# Patient Record
Sex: Female | Born: 2000 | Race: White | Hispanic: No | Marital: Single | Smoking: Never smoker
Health system: Southern US, Community
[De-identification: ages and names within clinical notes are randomized; demographics above are authoritative.]

## PROBLEM LIST (undated history)

## (undated) DIAGNOSIS — R195 Other fecal abnormalities: Secondary | ICD-10-CM

## (undated) DIAGNOSIS — R109 Unspecified abdominal pain: Secondary | ICD-10-CM

## (undated) HISTORY — DX: Other fecal abnormalities: R19.5

## (undated) HISTORY — DX: Unspecified abdominal pain: R10.9

---
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
## 2023-01-22 ENCOUNTER — Emergency Department
Admission: EM | Admit: 2023-01-22 | Discharge: 2023-01-22 | Disposition: A | Discharge status: Home / Self Care | Payer: Medicaid Other | Attending: Emergency Medicine | Admitting: Emergency Medicine

## 2023-01-22 ENCOUNTER — Other Ambulatory Visit: Payer: Medicaid Other

## 2023-01-22 ENCOUNTER — Emergency Department: Payer: Medicaid Other

## 2023-01-22 ENCOUNTER — Other Ambulatory Visit: Payer: Self-pay

## 2023-01-22 DIAGNOSIS — S92351A Displaced fracture of fifth metatarsal bone, right foot, initial encounter for closed fracture: Secondary | ICD-10-CM | POA: Diagnosis not present

## 2023-01-22 DIAGNOSIS — Y9301 Activity, walking, marching and hiking: Secondary | ICD-10-CM | POA: Diagnosis not present

## 2023-01-22 DIAGNOSIS — M7989 Other specified soft tissue disorders: Secondary | ICD-10-CM | POA: Diagnosis not present

## 2023-01-22 DIAGNOSIS — X501XXA Overexertion from prolonged static or awkward postures, initial encounter: Secondary | ICD-10-CM | POA: Insufficient documentation

## 2023-01-22 DIAGNOSIS — S99921A Unspecified injury of right foot, initial encounter: Secondary | ICD-10-CM | POA: Diagnosis present

## 2023-01-22 MED ORDER — OXYCODONE-ACETAMINOPHEN 5-325 MG PO TABS
1.0000 | ORAL_TABLET | Freq: Once | ORAL | Status: AC
  Administered 2023-01-22: 1 via ORAL
  Filled 2023-01-22: qty 1

## 2023-01-22 MED ORDER — HYDROCODONE-ACETAMINOPHEN 5-325 MG PO TABS: 1.0000 | ORAL_TABLET | ORAL | 0 refills | Status: AC | PRN

## 2023-01-22 NOTE — ED Triage Notes (Signed)
EMS states "called out for fall at home" patient states "I was walking off the steps and it was muddy and my foot slipped and twisted. I went in the house and could not walk on it"

## 2023-01-22 NOTE — ED Provider Notes (Signed)
[facility] Provider Note  Patient Contact: 9:58 PM (approximate)   History   Ankle Pain   HPI  Jordan Walsh is a 22 y.o. female who presents emergency department complaining of right foot pain.  Patient was coming down a flight of stairs when she landed awkwardly on her right foot.  Patient felt a crunching pain, is having significant pain to the midfoot.  No open wounds.  No history of previous fractures.  No other complaints at this time.     Physical Exam   Triage Vital Signs: ED Triage Vitals  Enc Vitals Group     BP 01/22/23 2113 114/65     Pulse Rate 01/22/23 2113 (!) 108     Resp 01/22/23 2113 19     Temp 01/22/23 2113 98.4 F (36.9 C)     Temp Source 01/22/23 2113 Oral     SpO2 01/22/23 2113 98 %     Weight 01/22/23 2114 170 lb (77.1 kg)     Height 01/22/23 2114 5\' 3"  (1.6 m)     Head Circumference --      Peak Flow --      Pain Score 01/22/23 2114 8     Pain Loc --      Pain Edu? --      Excl. in [place]? --     Most recent vital signs: Vitals:   01/22/23 2130 01/22/23 2200  BP: 123/72 106/72  Pulse: (!) 106 94  Resp:    Temp:    SpO2: 100% 100%     General: Alert and in no acute distress.  Cardiovascular:  Good peripheral perfusion Respiratory: Normal respiratory effort without tachypnea or retractions. Lungs CTAB.  Musculoskeletal: Full range of motion to all extremities.  Neurologic:  No gross focal neurologic deficits are appreciated.  Skin:   No rash noted Other:   ED Results / Procedures / Treatments   Labs (all labs ordered are listed, but only abnormal results are displayed) Labs Reviewed - No data to display   EKG     RADIOLOGY  I personally viewed, evaluated, and interpreted these images as part of my medical decision making, as well as reviewing the written report by the radiologist.  ED Provider Interpretation: Oblique fracture of the fifth metatarsal  DG Foot Complete Right  Result Date:  01/22/2023 CLINICAL DATA:  Recent twisting injury with foot pain initial encounter EXAM: RIGHT FOOT COMPLETE - 3+ VIEW COMPARISON:  None Available. FINDINGS: Oblique fracture through the mid to distal fifth metatarsal is noted. Minimal displacement is seen. No other fractures are noted. No significant soft tissue abnormality is seen. IMPRESSION: Fifth metatarsal fracture as described. Electronically Signed   By: Inez Catalina M.D.   On: 01/22/2023 22:34   DG Ankle Complete Right  Result Date: 01/22/2023 CLINICAL DATA:  Twisting injury with ankle pain, initial encounter EXAM: RIGHT ANKLE - COMPLETE 3+ VIEW COMPARISON:  None Available. FINDINGS: There is no evidence of fracture, dislocation, or joint effusion. There is no evidence of arthropathy or other focal bone abnormality. Soft tissues are unremarkable. IMPRESSION: No acute abnormality noted. Electronically Signed   By: Inez Catalina M.D.   On: 01/22/2023 22:33    PROCEDURES:  Critical Care performed: No  Procedures   MEDICATIONS ORDERED IN ED: Medications  oxyCODONE-acetaminophen (PERCOCET/ROXICET) 5-325 MG per tablet 1 tablet (1 tablet Oral Given 01/22/23 2201)     IMPRESSION / MDM / ASSESSMENT AND PLAN / ED COURSE  I reviewed  the triage vital signs and the nursing notes.                                 Differential diagnosis includes, but is not limited to, foot contusion, foot sprain, foot fracture  Patient's presentation is most consistent with acute presentation with potential threat to life or bodily function.   Patient's diagnosis is consistent with fracture of the fifth metatarsal.  Patient presents emergency department foot pain after missing a step, feeling a crunching pain in the side of her foot.  Patient has extreme tenderness over the fifth metatarsal.  X-ray confirms fracture of this area.  Patiently placed in a splint, crutches for ambulation.  Limited amount of pain medication is prescribed for the patient.  Follow-up  with podiatry.  Return precautions discussed with the patient..  Patient is given ED precautions to return to the ED for any worsening or new symptoms.     FINAL CLINICAL IMPRESSION(S) / ED DIAGNOSES   Final diagnoses:  Closed displaced fracture of fifth metatarsal bone of right foot, initial encounter     Rx / DC Orders   ED Discharge Orders          Ordered    HYDROcodone-acetaminophen (NORCO/VICODIN) 5-325 MG tablet  Every 4 hours PRN        01/22/23 2303             Note:  This document was prepared using Dragon voice recognition software and may include unintentional dictation errors.   Brynda Peon 01/22/23 2303    Rada Hay, MD 01/23/23 [phone]

## 2023-01-22 NOTE — ED Notes (Signed)
Error in documentation. Patient not currently discharged

## 2023-01-22 NOTE — ED Notes (Signed)
2+ pedal pulse present in right foot

## 2023-01-24 ENCOUNTER — Ambulatory Visit (INDEPENDENT_AMBULATORY_CARE_PROVIDER_SITE_OTHER): Payer: Medicaid Other | Admitting: Podiatry

## 2023-01-24 DIAGNOSIS — S92301D Fracture of unspecified metatarsal bone(s), right foot, subsequent encounter for fracture with routine healing: Secondary | ICD-10-CM | POA: Diagnosis not present

## 2023-01-24 MED ORDER — HYDROCODONE-ACETAMINOPHEN 10-325 MG PO TABS: 1.0000 | ORAL_TABLET | ORAL | 0 refills | Status: AC | PRN

## 2023-01-24 MED ORDER — KETOROLAC TROMETHAMINE 10 MG PO TABS: 10.0000 mg | ORAL_TABLET | Freq: Four times a day (QID) | ORAL | 0 refills | Status: AC | PRN

## 2023-01-24 NOTE — Progress Notes (Signed)
  Subjective:  Patient ID: Jordan Walsh, female    DOB: October 21, 2001,  MRN: [id number]  Chief Complaint  Patient presents with   Foot Pain    Left foot  Had xrays at hospital     22 y.o. female presents with the above complaint.  Patient presents with follow-up from emergency room for right foot fracture.  Patient states that she twisted it while in the muddy yard.  She wanted to get it evaluated she would like to discuss if she needs surgery.  She has not seen anyone as prior to the emergency room.  Pain scale is 5 out of 10 hurts with ambulation worse with pressure she is currently taking hydrocodone which does not seem to be helping with her pain.  Date of injury 01/22/2023   Review of Systems: Negative except as noted in the HPI. Denies N/V/F/Ch.  Past Medical History:  Diagnosis Date   Abdominal pain, recurrent    Stool culture positive for Clostridium difficile     Current Outpatient Medications:    desonide (DESOWEN) 0.05 % cream, Apply to whitish patch on face twice daily for 1 month., Disp: , Rfl:    HYDROcodone-acetaminophen (NORCO) 10-325 MG tablet, Take 1 tablet by mouth every 4 (four) hours as needed for up to 5 days., Disp: 30 tablet, Rfl: 0   ketorolac (TORADOL) 10 MG tablet, Take 1 tablet (10 mg total) by mouth every 6 (six) hours as needed., Disp: 20 tablet, Rfl: 0   ondansetron (ZOFRAN-ODT) 4 MG disintegrating tablet, Take by mouth., Disp: , Rfl:    HYDROcodone-acetaminophen (NORCO/VICODIN) 5-325 MG tablet, Take 1 tablet by mouth every 4 (four) hours as needed for moderate pain., Disp: 20 tablet, Rfl: 0  Social History   Tobacco Use  Smoking Status Never  Smokeless Tobacco Never    No Known Allergies Objective:  There were no vitals filed for this visit. There is no height or weight on file to calculate BMI. Constitutional Well developed. Well nourished.  Vascular Dorsalis pedis pulses palpable bilaterally. Posterior tibial pulses palpable  bilaterally. Capillary refill normal to all digits.  No cyanosis or clubbing noted. Pedal hair growth normal.  Neurologic Normal speech. Oriented to person, place, and time. Epicritic sensation to light touch grossly present bilaterally.  Dermatologic Nails well groomed and normal in appearance. No open wounds. No skin lesions.  Orthopedic: Pain on palpation to the right fifth metatarsal phalangeal joint head.  No rate pain with range of motion of the fifth digit on the MPJ.  Ecchymosis noted.  Swelling noted.   Radiographs: Oblique fracture through the mid to distal fifth metatarsal is noted. Minimal displacement is seen. No other fractures are noted. No significant soft tissue abnormality is seen. Assessment:   1. Closed fracture of head of metatarsal bone of right foot with routine healing    Plan:  Patient was evaluated and treated and all questions answered.  Right fifth metatarsal fracture nondisplaced -All questions concerns were discussed with the patient in extensive detail.  Given the amount of pain that she is having I will increase her dosage of hydrocodone from 5 mg to 10 mg with incorporation of Toradol. -She will benefit from cam boot immobilization.  At this time this is a nondisplaced fracture does not require surgery I discussed with the patient she states understanding.  Cam boot was dispensed  No follow-ups on file.

## 2023-02-06 ENCOUNTER — Ambulatory Visit: Payer: Medicaid Other | Admitting: Podiatry

## 2023-03-03 ENCOUNTER — Ambulatory Visit: Payer: Medicaid Other | Admitting: Podiatry

## 2023-03-03 DIAGNOSIS — S92301D Fracture of unspecified metatarsal bone(s), right foot, subsequent encounter for fracture with routine healing: Secondary | ICD-10-CM | POA: Diagnosis not present

## 2023-03-03 NOTE — Progress Notes (Signed)
  Subjective:  Patient ID: Jordan Walsh, female    DOB: 04-27-2001,  MRN: [id number]  Chief Complaint  Patient presents with   Fracture    Pt stated that she is doing better     22 y.o. female presents with the above complaint.  Patient presents with follow-up of right foot fracture.  She states she is doing a lot better.  Cam boot immobilization helped considerably.  Denies any other acute complaint she has transition to regular shoes without any acute issues   Review of Systems: Negative except as noted in the HPI. Denies N/V/F/Ch.  Past Medical History:  Diagnosis Date   Abdominal pain, recurrent    Stool culture positive for Clostridium difficile     Current Outpatient Medications:    desonide (DESOWEN) 0.05 % cream, Apply to whitish patch on face twice daily for 1 month., Disp: , Rfl:    HYDROcodone-acetaminophen (NORCO/VICODIN) 5-325 MG tablet, Take 1 tablet by mouth every 4 (four) hours as needed for moderate pain., Disp: 20 tablet, Rfl: 0   ketorolac (TORADOL) 10 MG tablet, Take 1 tablet (10 mg total) by mouth every 6 (six) hours as needed., Disp: 20 tablet, Rfl: 0   ondansetron (ZOFRAN-ODT) 4 MG disintegrating tablet, Take by mouth., Disp: , Rfl:   Social History   Tobacco Use  Smoking Status Never  Smokeless Tobacco Never    No Known Allergies Objective:  There were no vitals filed for this visit. There is no height or weight on file to calculate BMI. Constitutional Well developed. Well nourished.  Vascular Dorsalis pedis pulses palpable bilaterally. Posterior tibial pulses palpable bilaterally. Capillary refill normal to all digits.  No cyanosis or clubbing noted. Pedal hair growth normal.  Neurologic Normal speech. Oriented to person, place, and time. Epicritic sensation to light touch grossly present bilaterally.  Dermatologic Nails well groomed and normal in appearance. No open wounds. No skin lesions.  Orthopedic: No further pain on palpation to the  right fifth metatarsal phalangeal joint head.  No rate pain with range of motion of the fifth digit on the MPJ.  No further ecchymosis noted.  No further swelling noted.   Radiographs: Oblique fracture through the mid to distal fifth metatarsal is noted. Minimal displacement is seen. No other fractures are noted. No significant soft tissue abnormality is seen. Assessment:   1. Closed fracture of head of metatarsal bone of right foot with routine healing     Plan:  Patient was evaluated and treated and all questions answered.  Right fifth metatarsal fracture nondisplaced -Clinically healed and doing much better.  She has returned to regular shoes.  At this time if any foot and ankle issues on future she will come back and see me.  I encouraged her that I will continue to heal from the inside and she may have occasional pain and achiness but overall her pain will improve as time goes on.  She states understanding No follow-ups on file.

## 2023-03-20 ENCOUNTER — Ambulatory Visit: Payer: Medicaid Other | Admitting: Podiatry

## 2023-03-29 ENCOUNTER — Ambulatory Visit: Payer: Medicaid Other | Admitting: Podiatry

## 2023-10-04 ENCOUNTER — Ambulatory Visit
Admission: EM | Admit: 2023-10-04 | Discharge: 2023-10-04 | Payer: Medicaid Other | Attending: Family Medicine | Admitting: Family Medicine

## 2023-10-04 DIAGNOSIS — K921 Melena: Secondary | ICD-10-CM

## 2023-10-04 DIAGNOSIS — R1033 Periumbilical pain: Secondary | ICD-10-CM

## 2023-10-04 DIAGNOSIS — R197 Diarrhea, unspecified: Secondary | ICD-10-CM | POA: Diagnosis not present

## 2023-10-04 NOTE — ED Provider Notes (Signed)
[facility]    CSN: [id number] Arrival date & time: 10/04/23  1258      History   Chief Complaint Chief Complaint  Patient presents with   Abdominal Pain   Emesis   Nausea   Diarrhea    HPI Jordan Walsh is a 22 y.o. female.   HPI  Krisna presents for intermittent achy periumbilical abdominal pain that started last night. Pain doesn't radiate.  Reports feeling like she is constipated but is having non-bloody diarrhea. Had an episode of black stool. Vomited twice and the last time was this morning around 11 AM. Took Zofran this morning after vomiting.  Had 4 episodes 2 of which were today.  Nothing makes pain worse. Has worse pain with eating.   She has abdominal pain frequently in the same area.    Past Surgeries: no abdominal surgery   Symptoms Nausea/Vomiting: yes  Diarrhea: yes  Constipation: -   Melena/BRBPR: yes, melena  Hematemesis: no  Anorexia: no Fever/Chills: no  Dysuria: no  Urinary frequency or urgency: No  Rash: no  Wt loss: no  EtOH use: no  NSAIDs/ASA: infrequent   Patient's last menstrual period was 09/26/2023 (approximate). 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 No pertinent surgical history.  OB History   No obstetric history on file.      Home Medications    Prior to Admission medications   Medication Sig Start Date End Date Taking? Authorizing Provider  desonide (DESOWEN) 0.05 % cream Apply to whitish patch on face twice daily for 1 month. 09/05/13   [provider]  HYDROcodone-acetaminophen (NORCO/VICODIN) 5-325 MG tablet Take 1  tablet by mouth every 4 (four) hours as needed for moderate pain. 01/22/23 01/22/24  Cuthriell, Delorise Royals, PA-C  ketorolac (TORADOL) 10 MG tablet Take 1 tablet (10 mg total) by mouth every 6 (six) hours as needed. 01/24/23   Candelaria Stagers, DPM  ondansetron (ZOFRAN-ODT) 4 MG disintegrating tablet Take by mouth. 11/15/16   [provider]    Family History Family History  Problem Relation Age of Onset   Cholelithiasis Mother    Ulcers Mother    Constipation Sister    Constipation Brother    Constipation Brother    Constipation Brother     Social History Social History   Tobacco Use   Smoking status: Never   Smokeless tobacco: Never     Allergies   Patient has no known allergies.   Review of Systems Review of Systems :negative unless otherwise stated in HPI.      Physical Exam Triage Vital Signs ED Triage Vitals  Encounter Vitals Group     BP 10/04/23 1338 125/65     Systolic BP Percentile --      Diastolic BP Percentile --      Pulse Rate 10/04/23 1338 80     Resp 10/04/23 1338 16     Temp 10/04/23 1338 98.7 F (37.1 C)     Temp Source 10/04/23 1338 Oral     SpO2 10/04/23 1338 100 %  Weight 10/04/23 1337 170 lb (77.1 kg)     Height 10/04/23 1337 5\' 3"  (1.6 m)     Head Circumference --      Peak Flow --      Pain Score 10/04/23 1342 4     Pain Loc --      Pain Education --      Exclude from Growth Chart --    No data found.  Updated Vital Signs BP 125/65 (BP Location: Left Arm)   Pulse 80   Temp 98.7 F (37.1 C) (Oral)   Resp 16   Ht 5\' 3"  (1.6 m)   Wt 77.1 kg   LMP 09/26/2023 (Approximate)   SpO2 100%   BMI 30.11 kg/m   Visual Acuity Right Eye Distance:   Left Eye Distance:   Bilateral Distance:    Right Eye Near:   Left Eye Near:    Bilateral Near:     Physical Exam  GEN: non-toxic appearing female, in no acute distress  CV: regular rate and rhythm RESP: no increased work of breathing, clear to ascultation bilaterally ABD:  Bowel sounds present. Soft, epigastric, RUQ and periumbilical tenderness, non-distended. No guarding,no rebound,no appreciable hepatosplenomegaly,no CVA tenderness,negative McBurney's,negative Murphy MSK: no extremity edema SKIN: warm, dry, no rash on visible skin   UC Treatments / Results  Labs (all labs ordered are listed, but only abnormal results are displayed) Labs Reviewed  URINALYSIS, W/ REFLEX TO CULTURE (INFECTION SUSPECTED)  COMPREHENSIVE METABOLIC PANEL  CBC WITH DIFFERENTIAL/PLATELET  LIPASE, BLOOD  OCCULT BLOOD X 1 CARD TO LAB, STOOL    EKG  If EKG performed, see my interpretation and MDM section  Radiology No results found.   Procedures Procedures (including critical care time)  Medications Ordered in UC Medications - No data to display  Initial Impression / Assessment and Plan / UC Course  I have reviewed the triage vital signs and the nursing notes.  Pertinent labs & imaging results that were available during my care of the patient were reviewed by me and considered in my medical decision making (see chart for details).       Patient is a  22 y.o. femalewith history of abdominal pain who presents after having insidious severe periumbilical abdominal pain last night.  On chart review she had an ultrasound of her abdomen back in March or 2013 for abdominal pain and vomiting that showed slightly prominent common bile duct.    Overall, patient is non-toxic appearing, well-hydrated, and in no acute distress.  Vital signs stable.  [place] afebrile.  She has periumbilical, epigastric and right upper quadrant tenderness on exam.  Ordered Hemoccult, UA, CBC, CMP, and lipase. CT ABP Pelvis with contrast.  Considered COVID testing but she only has diarrhea and abdominal pain. She has not vomited since taking the Zofran this morning. Pt declined Hemoccult.   Her CT abdomen pelvis was declined by her insurance company.  After speaking with mom, who also agrees that  Jordan Walsh does need to have abdominal imaging and will take her to [facility] for further evaluation.  Urgent care workup truncated.      Follow-up, return and ED precautions given. Discussed MDM, treatment plan and plan for follow-up with patient and her mom who agrees with plan.    Final Clinical Impressions(s) / UC Diagnoses   Final diagnoses:  Periumbilical abdominal pain  Melena  Diarrhea, unspecified type     Discharge Instructions      You have been advised to follow  up immediately in the emergency department for concerning signs or symptoms as discussed during your visit. If you declined EMS transport, please have a family member take you directly to the ED at this time. Do not delay.   Based on concerns about condition, if you do not follow up in the ED, you may risk poor outcomes including worsening of condition, delayed treatment and potentially life threatening issues. If you have declined to go to the ED at this time, you should call your PCP immediately to set up a follow up appointment.   Go to ED for red flag symptoms, including; fevers you cannot reduce with Tylenol/Motrin, severe headaches, vision changes, numbness/weakness in part of the body, lethargy, confusion, intractable vomiting, severe dehydration, chest pain, breathing difficulty, severe persistent abdominal or pelvic pain, signs of severe infection (increased redness, swelling of an area), feeling faint or passing out, dizziness, etc. You should especially go to the ED for sudden acute worsening of condition if you do not elect to go at this time.        ED Prescriptions   None    PDMP not reviewed this encounter.   Katha Cabal, DO 10/04/23 1518

## 2023-10-04 NOTE — ED Notes (Signed)
Spoke with [employer]. With [facility] [employer] to submit prior auth for CT Abdomen Pelvis W Contrast, decision from ins sent to clinical review for further review tracking ID: [id number]

## 2023-10-04 NOTE — Discharge Instructions (Addendum)

## 2023-10-04 NOTE — ED Notes (Addendum)
Patient is being discharged from the Urgent Care and sent to the Emergency Department via POV . Per Dr.Brimage, patient is in need of higher level of care due to periumbilical pain,melena & diarrhea  . Patient is aware and verbalizes understanding of plan of care.  Vitals:   10/04/23 1338  BP: 125/65  Pulse: 80  Resp: 16  Temp: 98.7 F (37.1 C)  SpO2: 100%

## 2023-10-04 NOTE — ED Triage Notes (Signed)
Pt c/o abd pain & N/V/D x1 day. Had 1 episode of emesis today. States unable to keep any foods or fluids down. Has tried zofran w/o relief.

## 2024-12-02 ENCOUNTER — Ambulatory Visit (INDEPENDENT_AMBULATORY_CARE_PROVIDER_SITE_OTHER)

## 2024-12-02 DIAGNOSIS — Z79899 Other long term (current) drug therapy: Secondary | ICD-10-CM

## 2024-12-02 DIAGNOSIS — Z7189 Other specified counseling: Secondary | ICD-10-CM

## 2024-12-02 DIAGNOSIS — L700 Acne vulgaris: Secondary | ICD-10-CM

## 2024-12-02 NOTE — Patient Instructions (Signed)

## 2024-12-02 NOTE — Progress Notes (Signed)
 Subjective   Jordan Walsh is a 23 y.o. female who presents for the following: Acne. Patient is new patient  Today patient reports: Acne on the face with scaring on the chest and back. ; patient is currently using OTC washes and moisturizers. She has never tried prescription treatments.   Review of Systems:    No other skin or systemic complaints except as noted in HPI or Assessment and Plan.  The following portions of the chart were reviewed this encounter and updated as appropriate: medications, allergies, medical history  Relevant Medical History:  n/a   Objective  (SKPE) Well appearing patient in no apparent distress; mood and affect are within normal limits. Examination was performed of the: Focused Exam of: Face chest and back   Examination notable for: Acne Vulgaris - SEVERE: many open and closed comedones with associated erythema as well as tender nodules some of which are crusted located on the face and upper trunk.  Acneiform scarring is also present. PIH and scaring present on the upper back and chest.  Examination limited by: Clothing and Patient deferred removal       Assessment & Plan  (SKAP)   Acne vulgaris - severe, comedonal, inflammatory, and hormonal with PIH and scaring  - Chronic and persistent condition with duration or expected duration over one year. Condition is symptomatic and bothersome to patient. Patient is flaring and not currently at treatment goal.  - Discussed various treatment options with patient, as well as need for consistent use for at least 6-12 weeks for full efficacy.  - Reviewed treatment options, including side effects of topical agents, oral antibiotics, OCPs (if female), oral spironolactone (if female), and isotretinoin. Discussed that isotretinoin is the most effective  - Patient was recently prescribed OPC's but has not started them yet.  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 Condition is not at treatment goal  - Recalcitrant to years of failed over the counter acne treatments.  - Discussed treatment options and patient would like to pursue treatment with isotretinoin - Discussed side effects including but not limited to, dryness/irritation, headaches, vision changes, joint pains, GI distress, mood changes and emphasized need to report symptoms immediately if they arise. In addition, discussed the potential association of isotretinoin and IBD. - Discussed that acne lesions may initially worsen with therapy. - Reviewed teratogenic potential of isotretinoin and importance of not sharing medication and not donating blood; gave iPledge materials today to review. - Discussed need to check two serum or urine pregnancy tests before providing the first prescription (in fertile women at registration and at least 1 month later during the first 5 days of the menstrual cycle). In addition discussed the need to check monthly serum or urine pregnancy tests while taking the medication. - Educated patient about the 7-day prescription window in which the prescription must be filled, counting the day of the blood draw or urine sample as day 1. - Enrolled in iPledge #: [id number]  High Risk Medication Use (isotretinoin) - Will check UPT today, re-check in 1 month and can start after 2nd negative test - Methods of contraception:OCP and female latex condoms  - Check ALT, Triglycerides  - Patient understands that she must not become pregnant while on the medication - Patient understands to call with questions/concerns regarding the medication or side effects.  - Patient also understands importance of not sharing medication or donating blood  while on therapy.  Isotretinoin Counseling; Review and Contraception Counseling: Reviewed potential side effects of isotretinoin including xerosis, cheilitis, hepatitis,  hyperlipidemia, and severe birth defects if taken by a pregnant woman.  Women on isotretinoin must be celibate (not having sex) or required to use at least 2 birth control methods to prevent pregnancy (unless patient is a female of non-child bearing potential).  Females of child-bearing potential must have monthly pregnancy tests while on isotretinoin and report through I-Pledge (FDA monitoring program). Reviewed reports of suicidal ideation in those with a history of depression while taking isotretinoin and reports of diagnosis of inflammatory bowl disease (IBD) while taking isotretinoin as well as the lack of evidence for a causal relationship between isotretinoin, depression and IBD. Patient advised to reach out with any questions or concerns. Patient advised not to share pills or donate blood while on treatment or for one month after completing treatment. All patient's considering Isotretinoin must read and understand and sign Isotretinoin Consent Form and be registered with I-Pledge.     Procedures, orders, diagnosis for this visit:  HIGH RISK MEDICATION USE   Related Procedures ALT Triglycerides  High risk medication use -     ALT -     Triglycerides    Return to clinic: Return in about 30 days (around 01/01/2025) for ISO follow up.  I, Emerick Ege, CMA am acting as scribe for Lauraine JAYSON Kanaris, MD.   Documentation: I have reviewed the above documentation for accuracy and completeness, and I agree with the above.  Lauraine JAYSON Kanaris, MD

## 2024-12-09 ENCOUNTER — Telehealth: Payer: Self-pay

## 2024-12-09 NOTE — Telephone Encounter (Signed)
 No answer

## 2024-12-09 NOTE — Telephone Encounter (Signed)
 Can someone please remind pt to get labs done? Thx!

## 2024-12-10 NOTE — Telephone Encounter (Signed)
 No answer

## 2024-12-16 NOTE — Telephone Encounter (Signed)
 No answer

## 2024-12-23 NOTE — Telephone Encounter (Signed)
 Message states that call can not be completed as dialed.

## 2025-01-01 ENCOUNTER — Ambulatory Visit

## 2025-01-01 DIAGNOSIS — Z79899 Other long term (current) drug therapy: Secondary | ICD-10-CM | POA: Diagnosis not present

## 2025-01-01 DIAGNOSIS — Z7189 Other specified counseling: Secondary | ICD-10-CM | POA: Diagnosis not present

## 2025-01-01 DIAGNOSIS — L700 Acne vulgaris: Secondary | ICD-10-CM | POA: Diagnosis not present

## 2025-01-01 MED ORDER — ISOTRETINOIN 30 MG PO CAPS: 30.0000 mg | ORAL_CAPSULE | Freq: Every day | ORAL | 0 refills | Status: AC

## 2025-01-01 NOTE — Progress Notes (Signed)
" °  °  Subjective   Jordan Walsh is a 24 y.o. female who presents for the following: Acne on Accutane  . Patient is established patient   Today patient reports: Would like to proceed with Accutane    Review of Systems:    Isotretinoin  ROS - denies any bone pain, muscle pain, nausea, vomiting, diarrhea, blood in stool or urine .  The following portions of the chart were reviewed this encounter and updated as appropriate: medications, allergies, medical history  Relevant Medical History:  n/a   Objective  (SKPE) Well appearing patient in no apparent distress; mood and affect are within normal limits. Examination was performed of the: Focused Exam of: Face   Examination notable for: Acne Vulgaris - SEVERE: many open and closed comedones with associated erythema as well as tender nodules some of which are crusted located on the face and upper trunk.  Acneiform scarring is also present.      Assessment & Plan  (SKAP)   Severe Inflammatory Acne- Isotretinoin  start  Chronic condition with exacerbation or progression. Condition is not at treatment goal.  - Previous doses (0)  - iPledge #: [id number]  - Dosage weight: 77.1kg - Cumulative dose: 0 mg/kg  - Start isotretinoin  30 mg daily   High Risk Medication Use (isotretinoin ) - UPT obtained today and negative - Methods of contraception: OCP and female latex condoms  - ALT and Triglycerides ordered patient will go today  - Patient understands that she must not become pregnant while on the medication - Patient understands to call with questions/concerns regarding the medication or side effects.  - Patient also understands importance of not sharing medication or donating blood while on therapy.  Isotretinoin  Counseling; Review and Contraception Counseling: Reviewed potential side effects of isotretinoin  including xerosis, cheilitis, hepatitis, hyperlipidemia, and severe birth defects if taken by a pregnant woman.  Women on isotretinoin   must be celibate (not having sex) or required to use at least 2 birth control methods to prevent pregnancy (unless patient is a female of non-child bearing potential).  Females of child-bearing potential must have monthly pregnancy tests while on isotretinoin  and report through I-Pledge (FDA monitoring program). Reviewed reports of suicidal ideation in those with a history of depression while taking isotretinoin  and reports of diagnosis of inflammatory bowl disease (IBD) while taking isotretinoin  as well as the lack of evidence for a causal relationship between isotretinoin , depression and IBD. Patient advised to reach out with any questions or concerns. Patient advised not to share pills or donate blood while on treatment or for one month after completing treatment. All patient's considering Isotretinoin  must read and understand and sign Isotretinoin  Consent Form and be registered with I-Pledge.  Consider cetirizine - increases efficacy Consider fish oil - helps with dryness     Was sun protection counseling provided?: Yes   Level of service outlined above   Patient instructions (SKPI)   Procedures, orders, diagnosis for this visit:    There are no diagnoses linked to this encounter.  Return to clinic: Return in about 30 days (around 01/31/2025) for ISO.  I, Emerick Ege, CMA am acting as scribe for Lauraine JAYSON Kanaris, MD.   Documentation: I have reviewed the above documentation for accuracy and completeness, and I agree with the above.  Lauraine JAYSON Kanaris, MD  "

## 2025-01-01 NOTE — Patient Instructions (Signed)
 Isotretinoin (oral)  Pronunciation:  EYE so TRET i noyn Brand:  Absorica, Amnesteem, Claravis, Myorisan, Sotret, Zenatane  What is the most important information I should know about isotretinoin? Isotretinoin in just a single dose can cause severe birth defects or death of a baby. Never use this medicine if you are pregnant or may become pregnant. You must have a negative pregnancy test before taking isotretinoin. You will also be required to use two forms of birth control to prevent pregnancy while taking this medicine. Stop using isotretinoin and call your doctor at once if you think you might be pregnant.  What is isotretinoin? Isotretinoin is a form of vitamin A. It reduces the amount of oil released by oil glands in your skin, and helps your skin renew itself more quickly. Isotretinoin is used to treat severe nodular acne that has not responded to other treatments, including antibiotics. Isotretinoin is available only from a certified pharmacy under a special program called iPLEDGE. Isotretinoin may also be used for purposes not listed in this medication guide.  What should I discuss with my healthcare provider before taking isotretinoin? Isotretinoin can cause miscarriage, premature birth, severe birth defects, or death of a baby if the mother takes this medicine at the time of conception or during pregnancy. Even one dose of isotretinoin can cause major birth defects of the baby's ears, eyes, face, skull, heart, and brain. Never use isotretinoin if you are pregnant. For Women: Unless you have had your uterus and ovaries removed (total hysterectomy) or have been in menopause for at least 12 months in a row, you are considered to be of child-bearing potential. You must have a negative pregnancy test before you start taking isotretinoin, before each prescription is refilled, right after you take your last dose of isotretinoin, and again 30 days later. All pregnancy testing is required by the  [facility] program. You must agree in writing to use two specific forms of birth control beginning 30 days before you start taking isotretinoin and ending 30 days after your last dose. Both a primary and a secondary form of birth control must be used together.  Primary forms of birth control include: tubal ligation (tubes tied); vasectomy of the female sexual partner; an IUD (intrauterine device); estrogen-containing birth control pills (not mini-pills); and hormonal birth control patches, implants, injections, or vaginal ring. Secondary forms of birth control include: a female latex condom with or without spermicide; a diaphragm plus a spermicide; a cervical cap plus a spermicide; and a vaginal sponge containing a spermicide.  Not having sexual intercourse (abstinence) is the most effective method of preventing pregnancy. Stop using isotretinoin and call your doctor at once if you have unprotected sex, if you quit using birth control, if your period is late, or if you think you might be pregnant. If you get pregnant while taking isotretinoin, call the iPLEDGE pregnancy registry at [phone]. You should not use isotretinoin if you are allergic to it. Tell your doctor if you have ever had: depression or mental illness; asthma; liver disease; diabetes; heart disease or high cholesterol; osteoporosis or low bone mineral density; an eating disorder such as anorexia; a food or drug allergy; or an intestinal disorder such as inflammatory bowel disease or ulcerative colitis.  It is dangerous to try and purchase isotretinoin on the Internet or from vendors outside of the [place] . The sale and distribution of isotretinoin outside of the iPLEDGE program violates the regulations of the [place].S. Food and Drug Administration for the safe use  of this medication. You should not breast-feed while using this medicine. Isotretinoin is not approved for use by anyone younger than 22 years old.  How  should I take isotretinoin? Follow all directions on your prescription label and read all medication guides or instruction sheets. Use the medicine exactly as directed. Each prescription of isotretinoin must be filled within 7 days of the date it was written by your doctor. You will receive no more than a 30-day supply of isotretinoin at one time. Always take isotretinoin with a full glass of water. Do not chew or suck on the capsule. Swallow it whole. Follow all directions about taking isotretinoin with or without food. Use this medicine for the full prescribed length of time. Your acne may seem to get worse at first, but should then begin to improve. You may need frequent blood tests.  Never share this medicine with another person, even if they have the same symptoms you have.  Store at room temperature away from moisture, heat, and light.  What happens if I miss a dose? Skip the missed dose and use your next dose at the regular time. Do not use two doses at one time.  What happens if I overdose? Seek emergency medical attention or call the Poison Help line at [phone]. Overdose symptoms may include headache, dizziness, vomiting, stomach pain, warmth or tingling in your face, swollen or cracked lips, and loss of balance or coordination.  What should I avoid while taking isotretinoin? Do not take a vitamin or mineral supplement that contains vitamin A.  Do not donate blood while taking isotretinoin and for at least 30 days after you stop taking it.  Donated blood that is later given to a pregnant woman could lead to birth defects in her baby if the blood contains any level of isotretinoin. While you are taking isotretinoin and for at least 6 months after your last dose: Do not use wax hair removers or have dermabrasion or laser skin treatments. Scarring may result. Isotretinoin could make you sunburn more easily. Avoid sunlight or tanning beds. Wear protective clothing and use sunscreen  (SPF 30 or higher) when you are outdoors. Avoid driving or hazardous activity until you know how this medicine will affect you. Isotretinoin may impair your vision, especially at night.  What are the possible side effects of isotretinoin? Get emergency medical help if you have signs of an allergic reaction (hives, difficult breathing, swelling in your face or throat) or a severe skin reaction (fever, sore throat, burning eyes, skin pain, red or purple skin rash with blistering and peeling). Stop using isotretinoin and call your doctor at once if you have: problems with your vision or hearing; hallucinations, (see or hearing things that are not real), thoughts about suicide or hurting yourself; depressed mood, crying spells, changes in behavior, feeling angry or irritable; loss of interest in things you enjoyed before, feeling hopeless or guilty; sleep problems, extreme tiredness, trouble concentrating; changes in weight or appetite; a seizure (convulsions), sudden numbness or weakness; muscle weakness, pain in your bones or joints or in your back; severe diarrhea, rectal bleeding, bloody or tarry stools; pale skin, feeling light-headed or short of breath; severe stomach or chest pain, pain when swallowing; or dark urine, or jaundice (yellowing of your skin or eyes). Common side effects may include: dryness of your skin, lips, eyes, or nose (you may have nosebleeds). This is not a complete list of side effects and others may occur. Call your doctor for medical advice  about side effects. You may report side effects to FDA at 1-800-FDA-1088.  What other drugs will affect isotretinoin? Tell your doctor about all your other medicines, especially: phenytoin; St. John's wort; vitamin or mineral supplements; progestin-only birth control pills (mini-pills); steroid medicine; or a tetracycline antibiotic, including doxycycline or minocycline. This list is not complete. Other drugs may affect  isotretinoin, including prescription and over-the-counter medicines, vitamins, and herbal products. Not all possible drug interactions are listed here.  Where can I get more information? Your pharmacist can provide more information about isotretinoin. Remember, keep this and all other medicines out of the reach of children, never share your medicines with others, and use this medication only for the indication prescribed. Every effort has been made to ensure that the information provided by [employer] ('[employer]') is accurate, up-to-date, and complete, but no guarantee is made to that effect. Drug information contained herein may be time sensitive. [employer] information has been compiled for use by healthcare practitioners and consumers in the [place]  and therefore [employer] does not warrant that uses outside of the [place]  are appropriate, unless specifically indicated otherwise. [employer]'s drug information does not endorse drugs, diagnose patients or recommend therapy. [employer]'s drug information is an Investment banker, corporate to assist licensed healthcare practitioners in caring for their patients and/or to serve consumers viewing this service as a supplement to, and not a substitute for, the expertise, skill, knowledge and judgment of healthcare practitioners. The absence of a warning for a given drug or drug combination in no way should be construed to indicate that the drug or drug combination is safe, effective or appropriate for any given patient. Mult[employer] does not assume any responsibility for any aspect of healthcare administered with the aid of information [employer] provides. The information contained herein is not intended to cover all possible uses, directions, precautions, warnings, drug interactions, allergic reactions, or adverse effects. If you have questions about the drugs you are taking, check with your doctor, nurse or pharmacist.  Copyright [phone] [employer]  Version: 11.01. Revision date: 06/05/2017. Care instructions adapted under license by [facility]. If you have questions about a medical condition or this instruction, always ask your healthcare professional. [employer] Incorporated disclaims any warranty or liability for your use of this information.   Coping with the Dryness from Accutane  Dry eyes? Use preservative-free eye drops  Dry nose or bloody nose (due to dry nose)? Use saline nasal spray (NOT afrin-type products, saline only)  Dry face? Look for oil-free and non-comedogenic (means does not clog pores) lotions to moisturize acne-prone areas. Neutrogena, Aveeno, Oil of Olay. Sunscreen: Neutrogena Clear Face  Dry lips? Throughout the day and at bedtime, apply a lip balm that contains petrolatum/petroleum jelly or dimethicone. NOT chapstick, eos, burt's bees. Plain Vaseline (petroleum jelly) Aquaphor ointment Vaniply ointment Fix My Skin healing lip balm Cerave healing ointment  Dry skin on arms and legs?  Recommended body washes: Dove Sensitive Skin Nourishing Body Wash Unscented Aveeno Active Naturals Skin Relief Body Wash, Fragrance Free Cerave Hydrating Cleanser Olay Ultra Moisture Body Wash/ Olay Sensitive Body Wash Free & Clear (vanicream) liquid cleanser  Moisturizer:  Ointments and creams are thicker and thus provide better moisturization.   Recommended ointments: greasy, but do the best job at [employer] (vanicream) ointment [facility] [employer], Therapist, occupational, [employer]) Plain Vaseline (petroleum jelly) Aquaphor Healing Ointment Cerave Healing ointment  Recommended creams: Vanicream cream ([place] [employer], Therapist, occupational, [employer]) Cetaphil Moisturizing Cream CeraVe Moisturizing Cream Aveeno [employer]  Eczema Therapy Fragrance Free Moisturizing Cream (even if not a baby!) Aveeno skin relief overnight cream Eucerin Body Creme Eczema Relief Eucerin Original Healing Soothing Repair  Cream Eucerin Professional Repair intensive repair cream Gold Bond Diabetics Dry Skin relief hand or foot cream  [place] Bond eczema relief cream (not lotion)  You can use eczema creams even if you do not have eczema. This just means they are more moisturizing.  Lotions (come in a pump) are the weakest moisturizers.      Due to recent changes in healthcare laws, you may see results of your pathology and/or laboratory studies on MyChart before the doctors have had a chance to review them. We understand that in some cases there may be results that are confusing or concerning to you. Please understand that not all results are received at the same time and often the doctors may need to interpret multiple results in order to provide you with the best plan of care or course of treatment. Therefore, we ask that you please give us  2 business days to thoroughly review all your results before contacting the office for clarification. Should we see a critical lab result, you will be contacted sooner.   If You Need Anything After Your Visit  If you have any questions or concerns for your doctor, please call our main line at [phone] and press option 4 to reach your doctor's medical assistant. If no one answers, please leave a voicemail as directed and we will return your call as soon as possible. Messages left after 4 pm will be answered the following business day.   You may also send us  a message via MyChart. We typically respond to MyChart messages within 1-2 business days.  For prescription refills, please ask your pharmacy to contact our office. Our fax number is [phone].  If you have an urgent issue when the clinic is closed that cannot wait until the next business day, you can page your doctor at the number below.    Please note that while we do our best to be available for urgent issues outside of office hours, we are not available 24/7.   If you have an urgent issue and are unable to reach  us , you may choose to seek medical care at your doctor's office, retail clinic, urgent care center, or emergency room.  If you have a medical emergency, please immediately call 911 or go to the emergency department.  Pager Numbers  - Dr. Hester: [phone]  - Dr. Jackquline: [phone]  - Dr. Claudene: [phone]   - Dr. Raymund: [phone]  In the event of inclement weather, please call our main line at [phone] for an update on the status of any delays or closures.  Dermatology Medication Tips: Please keep the boxes that topical medications come in in order to help keep track of the instructions about where and how to use these. Pharmacies typically print the medication instructions only on the boxes and not directly on the medication tubes.   If your medication is too expensive, please contact our office at [phone] option 4 or send us  a message through MyChart.   We are unable to tell what your co-pay for medications will be in advance as this is different depending on your insurance coverage. However, we may be able to find a substitute medication at lower cost or fill out paperwork to get insurance to cover a needed medication.   If a prior authorization is required to get your medication covered by  your insurance company, please allow us  1-2 business days to complete this process.  Drug prices often vary depending on where the prescription is filled and some pharmacies may offer cheaper prices.  The website www.goodrx.com contains coupons for medications through different pharmacies. The prices here do not account for what the cost may be with help from insurance (it may be cheaper with your insurance), but the website can give you the price if you did not use any insurance.  - You can print the associated coupon and take it with your prescription to the pharmacy.  - You may also stop by our office during regular business hours and pick up a GoodRx coupon card.  - If  you need your prescription sent electronically to a different pharmacy, notify our office through [facility] or by phone at [phone] option 4.     Si Usted Necesita Algo Despus de Su Visita  Tambin puede enviarnos un mensaje a travs de Clinical cytogeneticist. Por lo general respondemos a los mensajes de MyChart en el transcurso de 1 a 2 das hbiles.  Para renovar recetas, por favor pida a su farmacia que se ponga en contacto con nuestra oficina. Randi lakes de fax es [place] [phone].  Si tiene un asunto urgente cuando la clnica est cerrada y que no puede esperar hasta el siguiente da hbil, puede llamar/localizar a su doctor(a) al nmero que aparece a continuacin.   Por favor, tenga en cuenta que aunque hacemos todo lo posible para estar disponibles para asuntos urgentes fuera del horario de [place], no estamos disponibles las 24 horas del da, los 7 [address]  [address] de la [place].   Si tiene un problema urgente y no puede comunicarse con nosotros, puede optar por buscar atencin mdica  en el consultorio de su doctor(a), en una clnica privada, en un centro de atencin urgente o en una sala de emergencias.  Si tiene Engineer, drilling, por favor llame inmediatamente al 911 o vaya a la sala de emergencias.  Nmeros de bper  - Dr. Hester: [phone]  - Dra. Jackquline: [phone]  - Dr. Claudene: [phone]  - Dra. Julietta Batterman: [phone]  En caso de inclemencias del [place], por favor llame a nuestra lnea principal al [phone] para una actualizacin sobre el estado de cualquier retraso o cierre.  Consejos para la medicacin en dermatologa: Por favor, guarde las cajas en las que vienen los medicamentos de uso tpico para ayudarle a seguir las instrucciones sobre dnde y cmo usarlos. Las farmacias generalmente imprimen las instrucciones del medicamento slo en las cajas y no directamente en los tubos del [place].   Si su medicamento es muy caro, por favor, pngase en contacto  con landry rieger llamando al [phone] y presione la opcin 4 o envenos un mensaje a travs de Clinical cytogeneticist.   No podemos decirle cul ser su copago por los medicamentos por adelantado ya que esto es diferente dependiendo de la cobertura de su seguro. Sin embargo, es posible que podamos encontrar un medicamento sustituto a Audiological scientist un formulario para que el seguro cubra el medicamento que se considera necesario.   Si se requiere una autorizacin previa para que su compaa de seguros malta su medicamento, por favor permtanos de 1 a 2 das hbiles para completar este proceso.  Los precios de los medicamentos varan con frecuencia dependiendo del Environmental consultant de dnde se surte la receta y alguna farmacias pueden ofrecer precios ms baratos.  El sitio web www.goodrx.com tiene cupones para medicamentos de Health and safety inspector.  Los precios aqu no tienen en cuenta lo que podra costar con la ayuda del seguro (puede ser ms barato con su seguro), pero el sitio web puede darle el precio si no utiliz Tourist information centre manager.  - Puede imprimir el cupn correspondiente y llevarlo con su receta a la farmacia.  - Tambin puede pasar por nuestra oficina durante el horario de atencin regular y Education officer, museum una tarjeta de cupones de GoodRx.  - Si necesita que su receta se enve electrnicamente a una farmacia diferente, informe a nuestra oficina a travs de MyChart de [place] o por telfono llamando al [phone] y presione la opcin 4.

## 2025-01-02 ENCOUNTER — Ambulatory Visit: Payer: Self-pay

## 2025-01-02 LAB — ALT: ALT: 12 IU/L (ref 0–32)

## 2025-01-02 LAB — TRIGLYCERIDES: Triglycerides: 78 mg/dL (ref 0–149)

## 2025-01-02 NOTE — Telephone Encounter (Signed)
 Message states that call can not be completed as dialed.

## 2025-01-02 NOTE — Telephone Encounter (Signed)
-----   Message from Lauraine Kanaris, MD sent at 01/02/2025  8:16 AM EST ----- Please notify patient with below plan: Labs wnl   Ok to start accutane

## 2025-01-15 NOTE — Telephone Encounter (Signed)
-----   Message from Lauraine Kanaris, MD sent at 01/02/2025  8:16 AM EST ----- Please notify patient with below plan: Labs wnl   Ok to start accutane

## 2025-01-15 NOTE — Telephone Encounter (Signed)
 Left voicemail to return my call

## 2025-01-21 NOTE — Telephone Encounter (Signed)
-----   Message from Lauraine Kanaris, MD sent at 01/02/2025  8:16 AM EST ----- Please notify patient with below plan: Labs wnl   Ok to start accutane

## 2025-01-21 NOTE — Telephone Encounter (Signed)
 Left voicemail to return my call

## 2025-02-03 ENCOUNTER — Ambulatory Visit
